# Patient Record
Sex: Female | Born: 1970 | Race: White | Hispanic: No | Marital: Married | Smoking: Current every day smoker
Health system: Southern US, Community
[De-identification: ages and names within clinical notes are randomized; demographics above are authoritative.]

## PROBLEM LIST (undated history)

## (undated) DIAGNOSIS — J189 Pneumonia, unspecified organism: Secondary | ICD-10-CM

## (undated) HISTORY — PX: CHOLECYSTECTOMY: SHX55

## (undated) HISTORY — PX: ABDOMINAL HYSTERECTOMY: SHX81

---

## 2012-02-05 ENCOUNTER — Encounter (HOSPITAL_COMMUNITY): Payer: Self-pay | Admitting: *Deleted

## 2012-02-05 ENCOUNTER — Emergency Department (HOSPITAL_COMMUNITY)
Admission: EM | Admit: 2012-02-05 | Discharge: 2012-02-05 | Disposition: A | Discharge status: Home / Self Care | Payer: Self-pay | Attending: Emergency Medicine | Admitting: Emergency Medicine

## 2012-02-05 DIAGNOSIS — Z880 Allergy status to penicillin: Secondary | ICD-10-CM | POA: Insufficient documentation

## 2012-02-05 DIAGNOSIS — X58XXXA Exposure to other specified factors, initial encounter: Secondary | ICD-10-CM | POA: Insufficient documentation

## 2012-02-05 DIAGNOSIS — S025XXA Fracture of tooth (traumatic), initial encounter for closed fracture: Secondary | ICD-10-CM | POA: Insufficient documentation

## 2012-02-05 DIAGNOSIS — K0889 Other specified disorders of teeth and supporting structures: Secondary | ICD-10-CM

## 2012-02-05 DIAGNOSIS — F17200 Nicotine dependence, unspecified, uncomplicated: Secondary | ICD-10-CM | POA: Insufficient documentation

## 2012-02-05 MED ORDER — OXYCODONE-ACETAMINOPHEN 5-325 MG PO TABS
1.0000 | ORAL_TABLET | Freq: Once | ORAL | Status: AC
  Administered 2012-02-05: 1 via ORAL
  Filled 2012-02-05: qty 1

## 2012-02-05 MED ORDER — BUPIVACAINE HCL (PF) 0.5 % IJ SOLN
1.8000 mL | Freq: Once | INTRAMUSCULAR | Status: AC
  Administered 2012-02-05: 1.8 mL
  Filled 2012-02-05: qty 10

## 2012-02-05 MED ORDER — CLINDAMYCIN HCL 150 MG PO CAPS: 300.0000 mg | ORAL_CAPSULE | Freq: Two times a day (BID) | ORAL | Status: AC

## 2012-02-05 MED ORDER — CLINDAMYCIN HCL 150 MG PO CAPS
300.0000 mg | ORAL_CAPSULE | Freq: Once | ORAL | Status: AC
  Administered 2012-02-05: 300 mg via ORAL
  Filled 2012-02-05: qty 2

## 2012-02-05 MED ORDER — HYDROCODONE-ACETAMINOPHEN 5-325 MG PO TABS: 1.0000 | ORAL_TABLET | ORAL | Status: AC | PRN

## 2012-02-05 NOTE — ED Provider Notes (Signed)
Medical screening examination/treatment/procedure(s) were performed by non-physician practitioner and as supervising physician I was immediately available for consultation/collaboration.   Willis Holquin, MD 02/05/12 2222 

## 2012-02-05 NOTE — ED Provider Notes (Signed)
History     CSN: [id number]  Arrival date & time 02/05/12  1815   First MD Initiated Contact with Patient 02/05/12 2000      Chief Complaint  Patient presents with  . Dental Pain    (Consider location/radiation/quality/duration/timing/severity/associated sxs/prior treatment) HPI History provided by pt.   Pt presents w/ severe right lower dental pain since yesterday.  Teeth broke several years ago.   Pain aggravated by eating.  Associated w/ tactile fever and swelling of right cheek.  Does not currently have a dentist.   History reviewed. No pertinent past medical history.  Past Surgical History  Procedure Date  . Abdominal hysterectomy   . Cholecystectomy     No family history on file.  History  Substance Use Topics  . Smoking status: Current Every Day Smoker  . Smokeless tobacco: Not on file  . Alcohol Use: No    OB History    Grav Para Term Preterm Abortions TAB SAB Ect Mult Living                  Review of Systems  All other systems reviewed and are negative.    Allergies  Penicillins  Home Medications   Current Outpatient Rx  Name Route Sig Dispense Refill  . CIPRO PO Oral Take 1 tablet by mouth once.    . IBUPROFEN 200 MG PO TABS Oral Take 800 mg by mouth every 6 (six) hours as needed. For dental pain      BP 126/87  Pulse 67  Temp 97.1 F (36.2 C) (Oral)  Resp 18  SpO2 99%  Physical Exam  Nursing note and vitals reviewed. Constitutional: She is oriented to person, place, and time. She appears well-developed and well-nourished.       Uncomfortable appearing  HENT:  Head: Normocephalic and atraumatic. No trismus in the jaw.  Mouth/Throat: Uvula is midline and oropharynx is clear and moist.       Right lower 2nd molar avulsed and very ttp.  Adjacent gingiva appears normal.  Minimal edema of buccal mucosa compared to the left side.  No overlying skin changes.  No trismus.   Eyes:       Normal appearance  Neck: Normal range of motion. Neck  supple.       No submandibular edema  Lymphadenopathy:    She has no cervical adenopathy.  Neurological: She is alert and oriented to person, place, and time.  Psychiatric: She has a normal mood and affect. Her behavior is normal.    ED Course  Dental Date/Time: 02/05/2012 9:18 PM Performed by: Otilio Miu Authorized by: Ruby Cola E Consent: Verbal consent obtained. Risks and benefits: risks, benefits and alternatives were discussed Consent given by: patient Patient understanding: patient states understanding of the procedure being performed Local anesthesia used: yes Local anesthetic: bupivacaine 0.5% without epinephrine Patient sedated: no Patient tolerance: Patient tolerated the procedure well with no immediate complications.   (including critical care time)    Labs Reviewed - No data to display No results found.   1. Toothache       MDM  Healthy 41yo F presents w/ right 2nd lower molar avulsion and severe pain.  Local bupivacaine injection administered w/ minimal pain relief.  First dose of clindamycin and 1 po percocet given.  Pt d/c'd home w/ clinda (pen allergic) and vicodin as well as referral to dentist on call and several low cost dental clinics.  Return precautions discussed.  Otilio Miu, [place] 02/05/12 2119

## 2012-02-05 NOTE — ED Notes (Signed)
Chronic rt. Lower toothache that needs pulled. Believes their an infection radiating from jaw down rt. Side of throat. Ibuprofen not relieving pain.

## 2013-04-30 ENCOUNTER — Emergency Department (HOSPITAL_BASED_OUTPATIENT_CLINIC_OR_DEPARTMENT_OTHER)
Admission: EM | Admit: 2013-04-30 | Discharge: 2013-04-30 | Disposition: A | Discharge status: Home / Self Care | Payer: Self-pay | Attending: Emergency Medicine | Admitting: Emergency Medicine

## 2013-04-30 ENCOUNTER — Encounter (HOSPITAL_BASED_OUTPATIENT_CLINIC_OR_DEPARTMENT_OTHER): Payer: Self-pay | Admitting: Emergency Medicine

## 2013-04-30 ENCOUNTER — Emergency Department (HOSPITAL_BASED_OUTPATIENT_CLINIC_OR_DEPARTMENT_OTHER): Payer: Self-pay

## 2013-04-30 DIAGNOSIS — Z8701 Personal history of pneumonia (recurrent): Secondary | ICD-10-CM | POA: Insufficient documentation

## 2013-04-30 DIAGNOSIS — J329 Chronic sinusitis, unspecified: Secondary | ICD-10-CM | POA: Insufficient documentation

## 2013-04-30 DIAGNOSIS — Z880 Allergy status to penicillin: Secondary | ICD-10-CM | POA: Insufficient documentation

## 2013-04-30 DIAGNOSIS — J029 Acute pharyngitis, unspecified: Secondary | ICD-10-CM | POA: Insufficient documentation

## 2013-04-30 DIAGNOSIS — F17200 Nicotine dependence, unspecified, uncomplicated: Secondary | ICD-10-CM | POA: Insufficient documentation

## 2013-04-30 HISTORY — DX: Pneumonia, unspecified organism: J18.9

## 2013-04-30 MED ORDER — DOXYCYCLINE HYCLATE 100 MG PO CAPS: 100.0000 mg | ORAL_CAPSULE | Freq: Two times a day (BID) | ORAL | Status: AC

## 2013-04-30 MED ORDER — FLUTICASONE PROPIONATE 50 MCG/ACT NA SUSP: 2.0000 | Freq: Every day | NASAL | Status: AC

## 2013-04-30 NOTE — ED Provider Notes (Signed)
Medical screening examination/treatment/procedure(s) were performed by non-physician practitioner and as supervising physician I was immediately available for consultation/collaboration.  EKG Interpretation   None         William Tejon Gracie, MD 04/30/13 1324 

## 2013-04-30 NOTE — ED Notes (Signed)
NP at bedside.

## 2013-04-30 NOTE — ED Notes (Signed)
Head and nasal congestion associated with cough x 1 week.  Symptoms unrelieved after taking OTC Mucinex.

## 2013-04-30 NOTE — ED Provider Notes (Signed)
CSN: [id number]     Arrival date & time 04/30/13  1148 History   First MD Initiated Contact with Patient 04/30/13 1207     Chief Complaint  Patient presents with  . Cough  . Nasal Congestion   (Consider location/radiation/quality/duration/timing/severity/associated sxs/prior Treatment) HPI Comments: Pt c/o cough, nasal congestion, facial pressure and sore throat times 1 week:pt has been taking mucinex without relief:fever initially but they have resolved:current smoker:history of pneumonia  The history is provided by the patient. No language interpreter was used.    Past Medical History  Diagnosis Date  . Pneumonia    Past Surgical History  Procedure Laterality Date  . Abdominal hysterectomy    . Cholecystectomy     No family history on file. History  Substance Use Topics  . Smoking status: Current Every Day Smoker -- 1.00 packs/day    Types: Cigarettes  . Smokeless tobacco: Not on file  . Alcohol Use: No   OB History   Grav Para Term Preterm Abortions TAB SAB Ect Mult Living                 Review of Systems  Constitutional: Negative.   HENT: Positive for rhinorrhea.   Respiratory: Positive for cough.   Cardiovascular: Negative.     Allergies  Penicillins  Home Medications   Current Outpatient Rx  Name  Route  Sig  Dispense  Refill  . Ciprofloxacin (CIPRO PO)   Oral   Take 1 tablet by mouth once.         . doxycycline (VIBRAMYCIN) 100 MG capsule   Oral   Take 1 capsule (100 mg total) by mouth 2 (two) times daily.   14 capsule   0   . fluticasone (FLONASE) 50 MCG/ACT nasal spray   Each Nare   Place 2 sprays into both nostrils daily.   16 g   0   . ibuprofen (ADVIL,MOTRIN) 200 MG tablet   Oral   Take 800 mg by mouth every 6 (six) hours as needed. For dental pain          BP 134/90  Pulse 83  Temp(Src) 98.5 F (36.9 C) (Oral)  Resp 18  Ht 5\' 6"  (1.676 m)  Wt 170 lb (77.111 kg)  BMI 27.45 kg/m2  SpO2 98% Physical Exam  Nursing note and  vitals reviewed. Constitutional: She appears well-developed.  HENT:  Right Ear: External ear normal.  Left Ear: External ear normal.  Nose: Mucosal edema and rhinorrhea present. Right sinus exhibits frontal sinus tenderness. Left sinus exhibits frontal sinus tenderness.  Mouth/Throat: Posterior oropharyngeal erythema present.  Eyes: Conjunctivae and EOM are normal. Pupils are equal, round, and reactive to light.  Neck: Normal range of motion. Neck supple.  Cardiovascular: Normal rate and regular rhythm.   Pulmonary/Chest: Effort normal and breath sounds normal.    ED Course  Procedures (including critical care time) Labs Review Labs Reviewed - No data to display Imaging Review Dg Chest 2 View  04/30/2013   CLINICAL DATA:  Cough  EXAM: CHEST  2 VIEW  COMPARISON:  None.  FINDINGS: The heart size and mediastinal contours are within normal limits. Both lungs are clear. The visualized skeletal structures are unremarkable.  IMPRESSION: No active cardiopulmonary disease.   Electronically Signed   By: Salome Holmes M.D.   On: 04/30/2013 12:28    EKG Interpretation   None       MDM   1. Sinusitis    Pt treated with doxy for  sinusitis:no pneumonia noted on x-ray   Teressa Lower, NP 04/30/13 1247

## 2014-03-31 ENCOUNTER — Ambulatory Visit (INDEPENDENT_AMBULATORY_CARE_PROVIDER_SITE_OTHER): Payer: Managed Care, Other (non HMO)

## 2014-03-31 ENCOUNTER — Encounter: Payer: Self-pay | Admitting: *Deleted

## 2014-03-31 VITALS — BP 129/84 | HR 74 | Resp 12

## 2014-03-31 DIAGNOSIS — M7730 Calcaneal spur, unspecified foot: Secondary | ICD-10-CM

## 2014-03-31 DIAGNOSIS — R52 Pain, unspecified: Secondary | ICD-10-CM

## 2014-03-31 DIAGNOSIS — M722 Plantar fascial fibromatosis: Secondary | ICD-10-CM

## 2014-03-31 MED ORDER — TRIAMCINOLONE ACETONIDE 10 MG/ML IJ SUSP: 10.0000 mg | Freq: Once | INTRAMUSCULAR | Status: AC

## 2014-03-31 MED ORDER — MELOXICAM 15 MG PO TABS: 15.0000 mg | ORAL_TABLET | Freq: Every day | ORAL | Status: AC

## 2014-03-31 NOTE — Patient Instructions (Signed)
ICE INSTRUCTIONS  Apply ice or cold pack to the affected area at least 3 times a day for 10-15 minutes each time.  You should also use ice after prolonged activity or vigorous exercise.  Do not apply ice longer than 20 minutes at one time.  Always keep a cloth between your skin and the ice pack to prevent burns.  Being consistent and following these instructions will help control your symptoms.  We suggest you purchase a gel ice pack because they are reusable and do bit leak.  Some of them are designed to wrap around the area.  Use the method that works best for you.  Here are some other suggestions for icing.   Use a frozen bag of peas or corn-inexpensive and molds well to your body, usually stays frozen for 10 to 20 minutes.  Wet a towel with cold water and squeeze out the excess until it's damp.  Place in a bag in the freezer for 20 minutes. Then remove and use.   Plantar Fasciitis Plantar fasciitis is a common condition that causes foot pain. It is soreness (inflammation) of the band of tough fibrous tissue on the bottom of the foot that runs from the heel bone (calcaneus) to the ball of the foot. The cause of this soreness may be from excessive standing, poor fitting shoes, running on hard surfaces, being overweight, having an abnormal walk, or overuse (this is common in runners) of the painful foot or feet. It is also common in aerobic exercise dancers and ballet dancers. SYMPTOMS  Most people with plantar fasciitis complain of:  Severe pain in the morning on the bottom of their foot especially when taking the first steps out of bed. This pain recedes after a few minutes of walking.  Severe pain is experienced also during walking following a long period of inactivity.  Pain is worse when walking barefoot or up stairs DIAGNOSIS  3. Your caregiver will diagnose this condition by examining and feeling your foot. 4. Special tests such as X-rays of your foot, are usually not  needed. PREVENTION   Consult a sports medicine professional before beginning a new exercise program.  Walking programs offer a good workout. With walking there is a lower chance of overuse injuries common to runners. There is less impact and less jarring of the joints.  Begin all new exercise programs slowly. If problems or pain develop, decrease the amount of time or distance until you are at a comfortable level.  Wear good shoes and replace them regularly.  Stretch your foot and the heel cords at the back of the ankle (Achilles tendon) both before and after exercise.  Run or exercise on even surfaces that are not hard. For example, asphalt is better than pavement.  Do not run barefoot on hard surfaces.  If using a treadmill, vary the incline.  Do not continue to workout if you have foot or joint problems. Seek professional help if they do not improve. HOME CARE INSTRUCTIONS   Avoid activities that cause you pain until you recover.  Use ice or cold packs on the problem or painful areas after working out.  Only take over-the-counter or prescription medicines for pain, discomfort, or fever as directed by your caregiver.  Soft shoe inserts or athletic shoes with air or gel sole cushions may be helpful.  If problems continue or become more severe, consult a sports medicine caregiver or your own health care provider. Cortisone is a potent anti-inflammatory medication that may be   injected into the painful area. You can discuss this treatment with your caregiver. MAKE SURE YOU:   Understand these instructions.  Will watch your condition.  Will get help right away if you are not doing well or get worse. Document Released: 02/01/2001 Document Revised: 08/01/2011 Document Reviewed: 04/02/2008 Exit[employer] Patient Information 2015 [employer]. This information is not intended to replace advice given to you by your health care provider. Make sure you discuss any questions you have with  your health care provider.  

## 2014-03-31 NOTE — Progress Notes (Signed)
   Subjective:    Patient ID: Kathy Crochetanielle Kerr, female    DOB: Feb 01, 1971, 43 y.o.   MRN: [id number]  HPI  PT STATED B/L BOTTOM OF THE HEEL HAVING SHARP PAIN FOR 6 MONTHS. HEELS ARE GETTING WORSE ESPECIALLY FIRST THING IN THE MORNING AND END OF THE DAY. HEEL GET AGGRAVATED BY PRESSURE. TRIED ICE AND STRETCHING IT HELP SOME.  Review of Systems  Constitutional: Positive for fatigue.  Cardiovascular: Positive for leg swelling.  All other systems reviewed and are negative.      Objective:   Physical Exam 43 year old female well-developed well-nourished oriented 3 presents this time with bilateral inferior heel and arch pain pain on first up or getting up from. Respiratory on for at least 6 months exquisite painful tender with walking standing and activities. Cottonoid type boots at work however hallux swelling flip-flops or barefoot or she stands or walks or does any activity. [place] objective findings as follows vascular status is intact with pedal pulses palpable DP and PT +2 over 4 bilateral capillary refill time 3 seconds epicritic and proprioceptive sensations intact and symmetric bilateral there is normal plantar response DTRs not elicited neurologically skin color pigment normal hair growth absent nails somewhat criptotic curvetted and friable with slight discoloration orthopedic biomechanical exam rectus foot type ankle metatarsal subtalar joint motions normal mild tennis on palpation of the medial band of plantar fascia medial calcaneal control mid arch to heal there is tenderness on abduction eversion of the foot stretch of the plantar fascia. 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       Assessment & Plan:  Assessment this time plantar fasciitis/heel spur syndrome bilateral plan at this time fascial strapping applied to both feet, prescription from oblique is issued a 15 minutes once daily: The pharmacy injection tender with Kenalog 20 Xylocaine plain infiltrated to the inferior calcaneal area bilateral heels patient is advised and ice compresses recommended molded meloxicam as needed for pain which is prescribed at this time recheck in 2-3 weeks for follow-up and be candidate for orthoses based on progress also bring work boots with her no barefoot or flimsy shoes or flip-flops wear crocs or Birken[employer]'s around the house at all times next  [employer] [place] Melita Villalona DPM

## 2014-04-14 ENCOUNTER — Ambulatory Visit: Payer: Managed Care, Other (non HMO)

## 2014-09-21 IMAGING — CR DG CHEST 2V
2 series · 2 of 2 positions shown · non-contrast
Comparison: None.

CLINICAL DATA: Cough

EXAM:
CHEST  2 VIEW

[w chest pa]
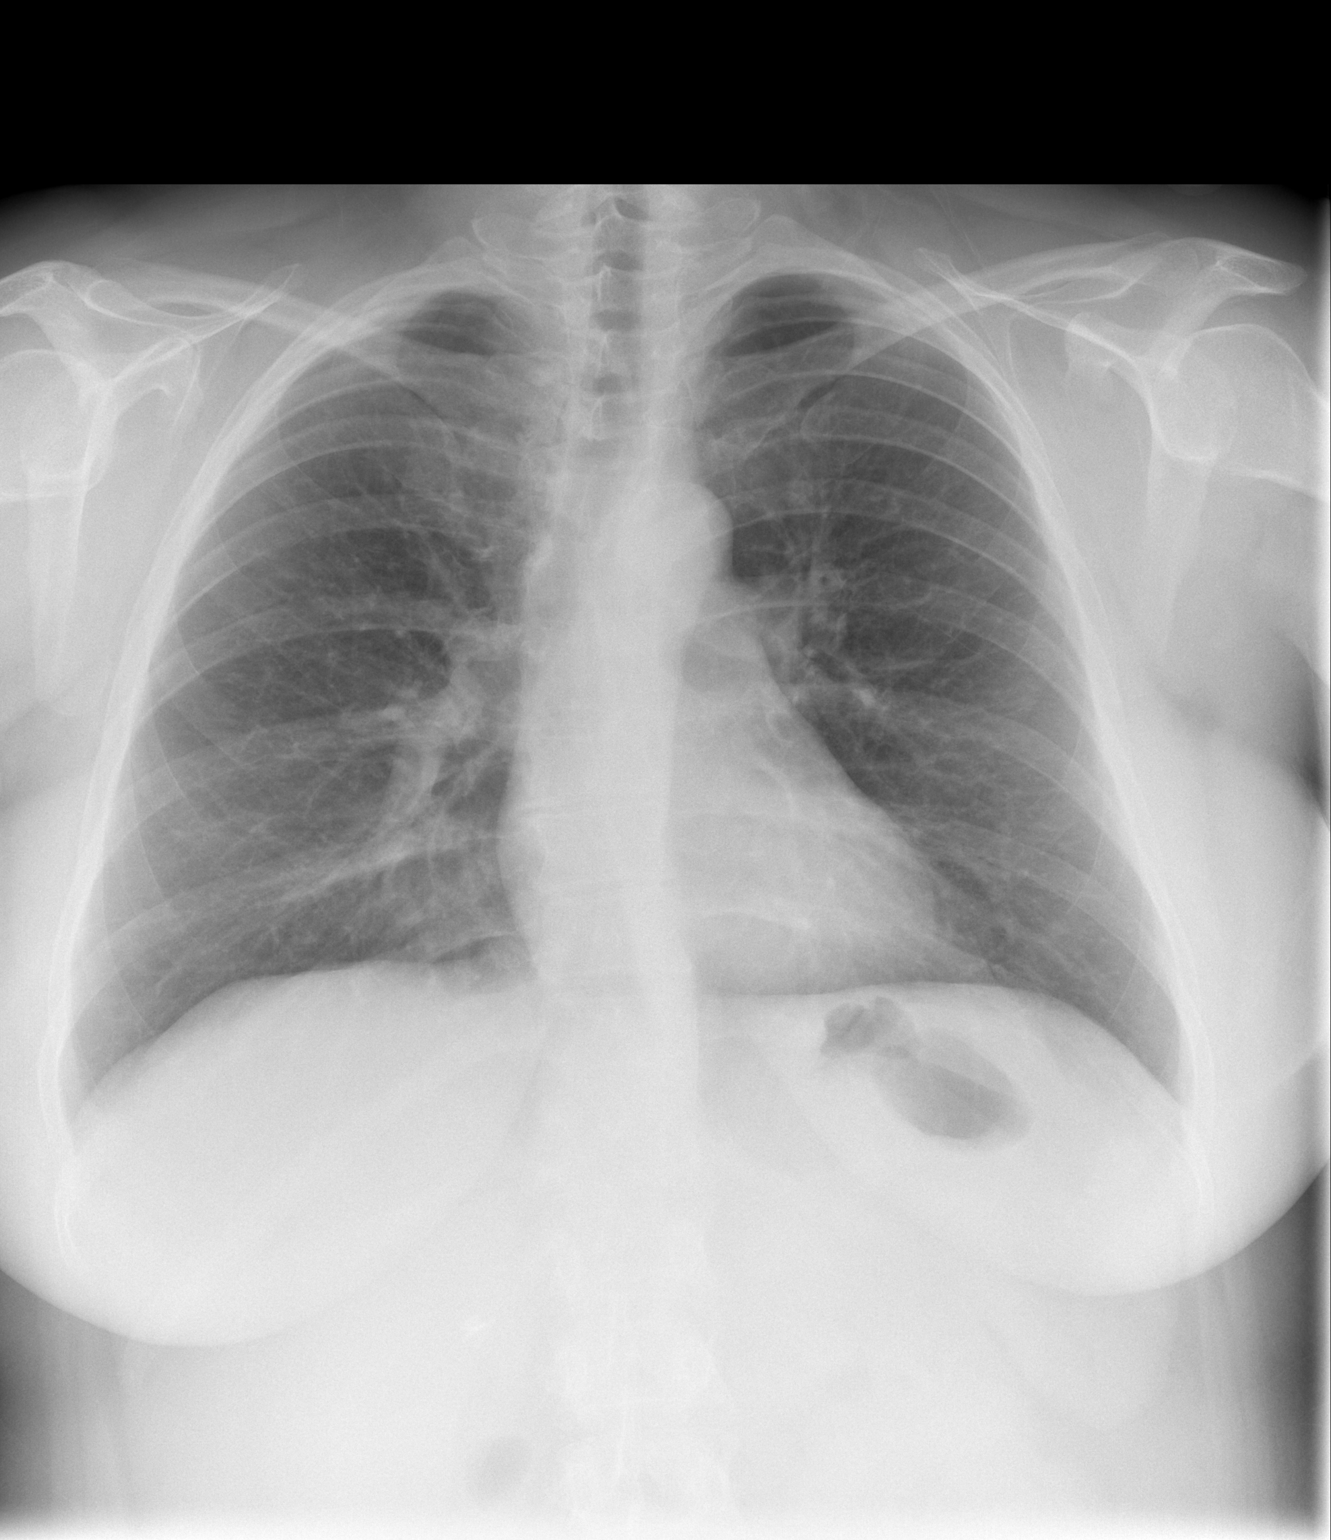

[w chest lat]
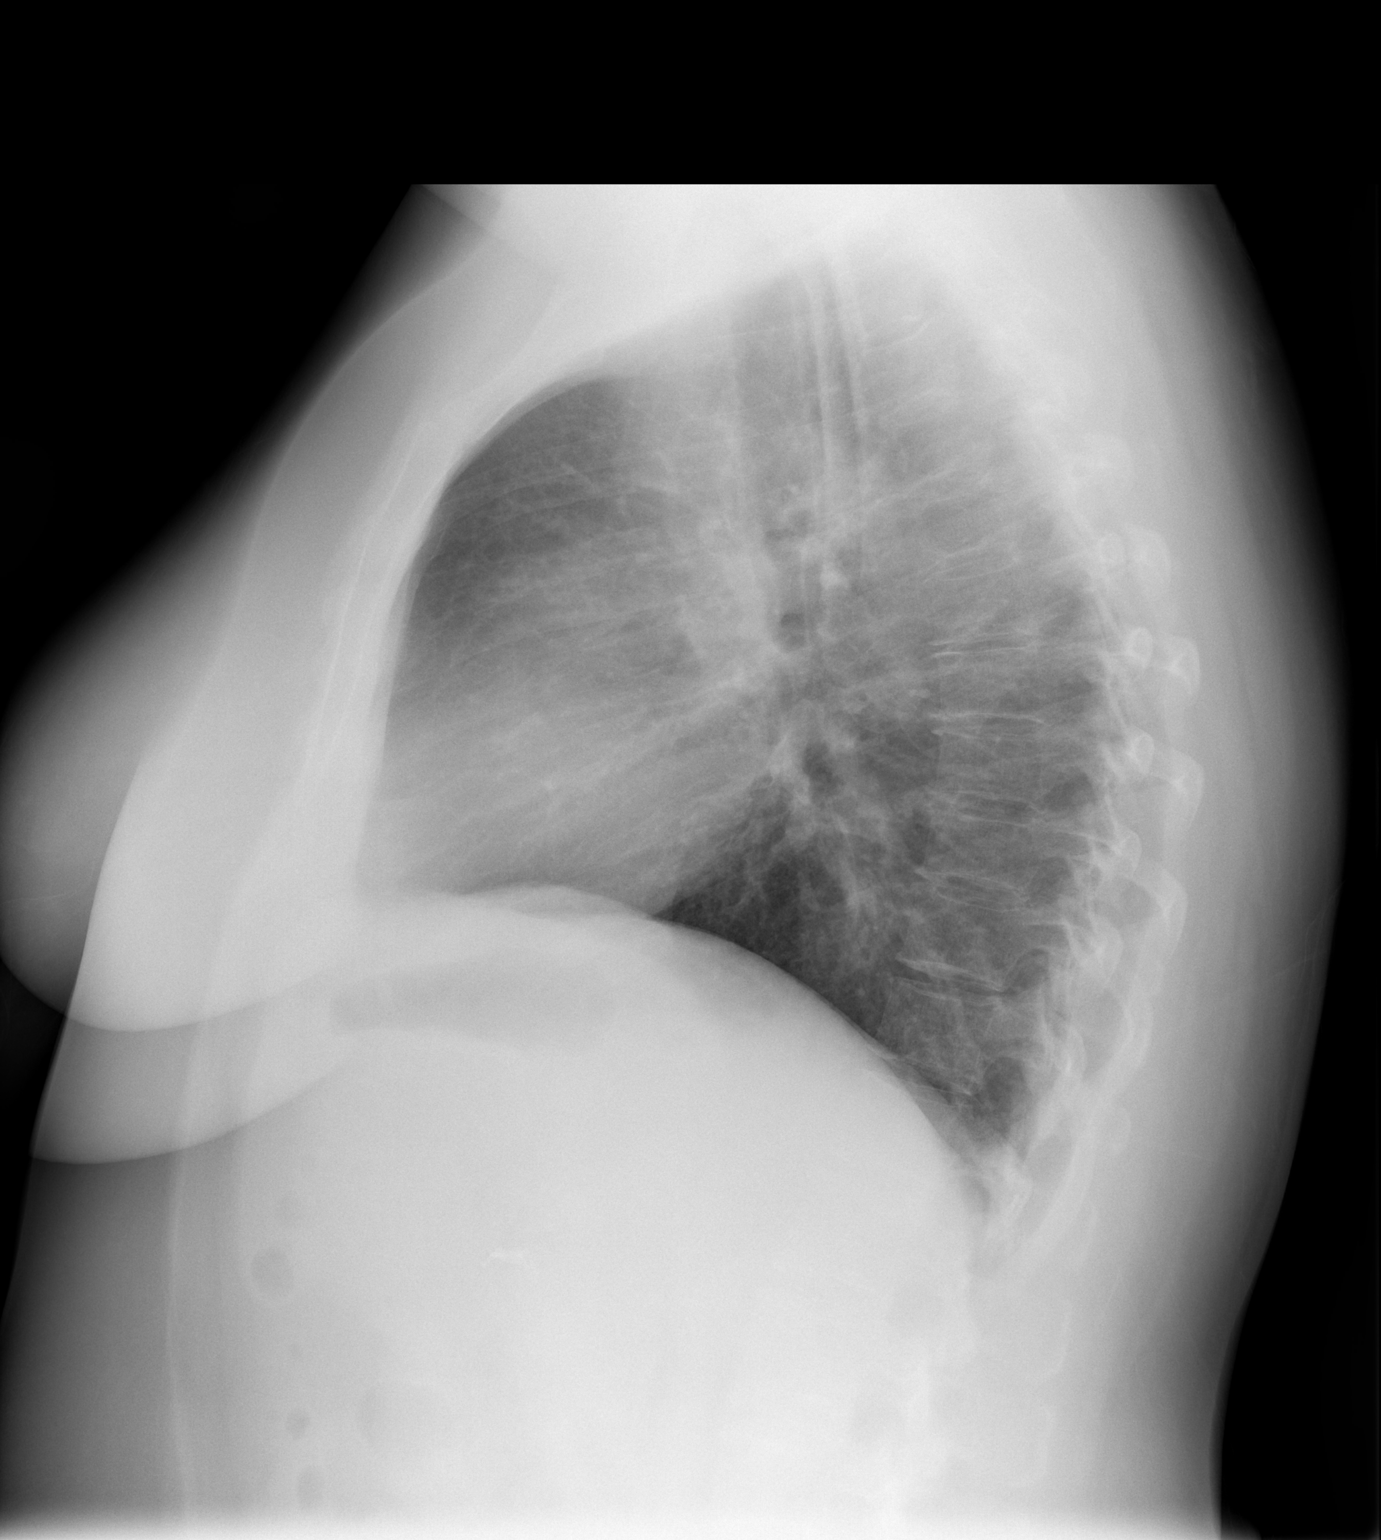

[2 of 2 positions shown; findings below may reference images not displayed]

FINDINGS: The heart size and mediastinal contours are within normal limits.
Both lungs are clear. The visualized skeletal structures are
unremarkable.
IMPRESSION: No active cardiopulmonary disease.

## 2014-09-25 ENCOUNTER — Encounter: Payer: Self-pay | Admitting: Podiatrist

## 2014-09-25 ENCOUNTER — Ambulatory Visit (INDEPENDENT_AMBULATORY_CARE_PROVIDER_SITE_OTHER): Payer: Managed Care, Other (non HMO) | Admitting: Podiatrist

## 2014-09-25 VITALS — BP 116/75 | HR 84 | Resp 18

## 2014-09-25 DIAGNOSIS — M722 Plantar fascial fibromatosis: Secondary | ICD-10-CM

## 2014-09-25 DIAGNOSIS — M7730 Calcaneal spur, unspecified foot: Secondary | ICD-10-CM | POA: Diagnosis not present

## 2014-09-25 MED ORDER — TRIAMCINOLONE ACETONIDE 10 MG/ML IJ SUSP
10.0000 mg | Freq: Once | INTRAMUSCULAR | Status: AC
  Administered 2014-09-25: 10 mg

## 2014-09-25 MED ORDER — TRIAMCINOLONE ACETONIDE 10 MG/ML IJ SUSP: 10.0000 mg | Freq: Once | INTRAMUSCULAR | Status: AC

## 2014-09-25 NOTE — Progress Notes (Signed)
   Chief Complaint  Patient presents with  . Plantar Fasciitis    both of my heels are hurting again and the left is worse and hurts into my ankle area and makes my left ankle weak and the right heel is not as bad and has been going on for about 5 weeks         Objective:   Physical Exam Neurovascular status intact and unchanged.  Pain along the plantar fascia at its insertion bilateral is noted with palpation.  No pain with medial to lateral heel squeeze. No pain when tapping along tarsal canal.       Assessment & Plan:  Assessment :  Bilateral plantar fasciitis  Plan: injected bilateral heels with kenalog and marcaine plain.  Discussed appropriate shoegear as she used to wear flip flops exclusively and has transitioned into sketchers memory foam. Discussed the unstable nature of the shoes and that they will continue to allow her foot to flex and cause the plantar fasciitis not to resolve. She maintains that she is unable to wear any other shoe types as they are too hard for her feet.  Advice given.  She will be seen back as needed for follow up.

## 2014-09-25 NOTE — Patient Instructions (Signed)
Wear a good fitting shoe-  Running shoe brands I recommend are [employer], [employer] and [employer]iics.  Always have a shoe fit specialist help you choose your shoes as there are many "varieties" of shoes and they can find you the best fit.  [employer]leet Feet sports/ [employer] ([place], [place] [employer], [employer] have trained staff to help you in this process.  The [employer] in [place] has a great selection of euro-comfort casual shoes with good comfort and support as well.  Avoid prolonged use of thin ballet type flats, thin sneakers or flip flops.  Everyday use of these shoes can cause or contribute to foot problems or injuries.    Plantar Fasciitis (Heel Spur Syndrome) with Rehab The plantar fascia is a fibrous, ligament-like, soft-tissue structure that spans the bottom of the foot. Plantar fasciitis is a condition that causes pain in the foot due to inflammation of the tissue. SYMPTOMS   Pain and tenderness on the underneath side of the foot.  Pain that worsens with standing or walking. CAUSES  Plantar fasciitis is caused by irritation and injury to the plantar fascia on the underneath side of the foot. Common mechanisms of injury include:  Direct trauma to bottom of the foot.  Damage to a small nerve that runs under the foot where the main fascia attaches to the heel bone. Stress placed on the plantar fascia due to any mild increased activity or injury RISK INCREASES WITH:   Obesity.  Poor strength and flexibility.  Improperly fitted shoes.  Tight calf muscles.  Flat feet.  Failure to warm-up properly before activity.  PREVENTION  Warm up and stretch properly before activity.  Strength, flexibility  Maintain a health body weight.  Avoid stress on the plantar fascia.  Wear properly fitted shoes, including arch supports for individuals who have flat feet. PROGNOSIS  If treated properly, then the symptoms of plantar fasciitis usually  resolve without surgery. However, occasionally surgery is necessary. RELATED COMPLICATIONS   Recurrent symptoms that may result in a chronic condition.  Problems of the lower back that are caused by compensating for the injury, such as limping.  Pain or weakness of the foot during push-off following surgery.  Chronic inflammation, scarring, and partial or complete fascia tear, occurring more often from repeated injections. TREATMENT  Treatment initially involves the use of ice and medication to help reduce pain and inflammation. The use of strengthening and stretching exercises may help reduce pain with activity, especially stretches of the Achilles tendon.  Your caregiver may recommend that you use arch supports to help reduce stress on the plantar fascia. Often, corticosteroid injections are given to reduce inflammation. If symptoms persist for greater than 6 months despite non-surgical (conservative), then surgery may be recommended.  MEDICATION   If pain medication is necessary, then nonsteroidal anti-inflammatory medications, such as aspirin and ibuprofen, or other minor pain relievers, such as acetaminophen, are often recommended. Corticosteroid injections may be given by your caregiver.  HEAT AND COLD  Cold treatment (icing) relieves pain and reduces inflammation. Cold treatment should be applied for 10 to 15 minutes every 2 to 3 hours for inflammation and pain and immediately after any activity that aggravates your symptoms. Use ice packs or massage the area with a piece of ice (ice massage).  Heat treatment may be used prior to performing the stretching and strengthening activities prescribed by your caregiver, physical therapist, or athletic trainer. Use a heat pack or soak the injury in warm water.  SEEK IMMEDIATE MEDICAL CARE IF:  Treatment seems to offer no benefit, or the condition worsens.  Any medications produce adverse side effects.  Perform this particular stretch daily  first thing in the morning and before you go to bed. Hold for 30 seconds.    Try all the exercises and choose your favorite 3 to perform daily--  EXERCISES-- perform each exercise a total of 10-15 repetitions.  Hold for 30 seconds and perform 3 times per day   RANGE OF MOTION (ROM) AND STRETCHING EXERCISES - Plantar Fasciitis (Heel Spur Syndrome) These exercises may help you when beginning to rehabilitate your injury.   While completing these exercises, remember:   Restoring tissue flexibility helps normal motion to return to the joints. This allows healthier, less painful movement and activity.  An effective stretch should be held for at least 30 seconds.  A stretch should never be painful. You should only feel a gentle lengthening or release in the stretched tissue. RANGE OF MOTION - Toe Extension, Flexion  Sit with your right / left leg crossed over your opposite knee.  Grasp your toes and gently pull them back toward the top of your foot. You should feel a stretch on the bottom of your toes and/or foot.  Hold this stretch for __________ seconds.  Now, gently pull your toes toward the bottom of your foot. You should feel a stretch on the top of your toes and or foot.  Hold this stretch for __________ seconds. Repeat __________ times. Complete this stretch __________ times per day.  RANGE OF MOTION - Ankle Dorsiflexion, Active Assisted  Remove shoes and sit on a chair that is preferably not on a carpeted surface.  Place right / left foot under knee. Extend your opposite leg for support.  Keeping your heel down, slide your right / left foot back toward the chair until you feel a stretch at your ankle or calf. If you do not feel a stretch, slide your bottom forward to the edge of the chair, while still keeping your heel down.  Hold this stretch for __________ seconds. Repeat __________ times. Complete this stretch __________ times per day.  STRETCH  Gastroc, Standing  Place  hands on wall.  Extend right / left leg, keeping the front knee somewhat bent.  Slightly point your toes inward on your back foot.  Keeping your right / left heel on the floor and your knee straight, shift your weight toward the wall, not allowing your back to arch.  You should feel a gentle stretch in the right / left calf. Hold this position for __________ seconds. Repeat __________ times. Complete this stretch __________ times per day. STRETCH  Soleus, Standing  Place hands on wall.  Extend right / left leg, keeping the other knee somewhat bent.  Slightly point your toes inward on your back foot.  Keep your right / left heel on the floor, bend your back knee, and slightly shift your weight over the back leg so that you feel a gentle stretch deep in your back calf.  Hold this position for __________ seconds. Repeat __________ times. Complete this stretch __________ times per day. STRETCH  Gastrocsoleus, Standing  Note: This exercise can place a lot of stress on your foot and ankle. Please complete this exercise only if specifically instructed by your caregiver.   Place the ball of your right / left foot on a step, keeping your other foot firmly on the same step.  Hold on to the wall or  a rail for balance.  Slowly lift your other foot, allowing your body weight to press your heel down over the edge of the step.  You should feel a stretch in your right / left calf.  Hold this position for __________ seconds.  Repeat this exercise with a slight bend in your right / left knee. Repeat __________ times. Complete this stretch __________ times per day.  STRENGTHENING EXERCISES - Plantar Fasciitis (Heel Spur Syndrome)  These exercises may help you when beginning to rehabilitate your injury. They may resolve your symptoms with or without further involvement from your physician, physical therapist or athletic trainer. While completing these exercises, remember:   Muscles can gain both  the endurance and the strength needed for everyday activities through controlled exercises.  Complete these exercises as instructed by your physician, physical therapist or athletic trainer. Progress the resistance and repetitions only as guided.

## 2014-10-01 ENCOUNTER — Telehealth: Payer: Self-pay | Admitting: *Deleted

## 2014-10-01 MED ORDER — METHYLPREDNISOLONE 4 MG PO TBPK: ORAL_TABLET | ORAL | Status: AC

## 2014-10-01 MED ORDER — DICLOFENAC SODIUM 75 MG PO TBEC: 75.0000 mg | DELAYED_RELEASE_TABLET | Freq: Two times a day (BID) | ORAL | Status: AC

## 2014-10-01 NOTE — Telephone Encounter (Addendum)
Pt states the 2nd set of injections in her heels have not worked as well as the 1st, and the Motrin 600mg  is not helping.  I called [phone] to leave Dr. Faylene MillionEgerton's orders, but the voicemail had not been set up yet.  I contacted pt with Dr. Faylene MillionEgerton's recommendations and encouraged pt to call with concerns and make an appt if not improvement.  Pt agreed.

## 2014-10-01 NOTE — Telephone Encounter (Signed)
Call in a steroid dose pack if she is able to tolerate and let switch to diclofenac 75 mg bid as well.    She may be at a point where she might need to consider a release of the ligament-- so maybe set up with [facility]?

## 2021-03-02 ENCOUNTER — Telehealth: Payer: Self-pay

## 2021-03-02 NOTE — Telephone Encounter (Signed)
Recall was for 11-22. Number on chart said voicemail hasn't been set up and number in [facility] but it had a Hydrologist.  Recall now set for 02-2026. Trying to get in touch with patient to verify number and if she went by [facility] at one time and if she remembers having a colon 03-04-2016 at [facility].
